# Patient Record
Sex: Male | Born: 2005 | Race: Black or African American | Hispanic: No | Marital: Single | Smoking: Never smoker
Health system: Southern US, Community
[De-identification: ages and names within clinical notes are randomized; demographics above are authoritative.]

## PROBLEM LIST (undated history)

## (undated) DIAGNOSIS — F909 Attention-deficit hyperactivity disorder, unspecified type: Secondary | ICD-10-CM

## (undated) DIAGNOSIS — A4902 Methicillin resistant Staphylococcus aureus infection, unspecified site: Secondary | ICD-10-CM

## (undated) DIAGNOSIS — T148XXA Other injury of unspecified body region, initial encounter: Secondary | ICD-10-CM

## (undated) DIAGNOSIS — J45909 Unspecified asthma, uncomplicated: Secondary | ICD-10-CM

## (undated) HISTORY — PX: CLOSED REDUCTION METACARPAL FRACTURE: SUR226

---
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
## 2024-06-08 ENCOUNTER — Ambulatory Visit
Admission: EM | Admit: 2024-06-08 | Discharge: 2024-06-08 | Disposition: A | Discharge status: Home / Self Care | Source: Home / Self Care

## 2024-06-08 ENCOUNTER — Encounter: Payer: Self-pay | Admitting: Emergency Medicine

## 2024-06-08 DIAGNOSIS — R300 Dysuria: Secondary | ICD-10-CM

## 2024-06-08 LAB — POCT URINE DIPSTICK
Bilirubin, UA: NEGATIVE
Glucose, UA: NEGATIVE mg/dL
Ketones, POC UA: NEGATIVE mg/dL
Nitrite, UA: NEGATIVE
POC PROTEIN,UA: NEGATIVE
Spec Grav, UA: 1.025
Urobilinogen, UA: 0.2 U/dL
pH, UA: 6.5

## 2024-06-08 MED ORDER — DOXYCYCLINE HYCLATE 100 MG PO CAPS: 100.0000 mg | ORAL_CAPSULE | Freq: Two times a day (BID) | ORAL | 0 refills | Status: AC

## 2024-06-08 MED ORDER — CEFTRIAXONE SODIUM 500 MG IJ SOLR
500.0000 mg | Freq: Once | INTRAMUSCULAR | Status: AC
  Administered 2024-06-08: 500 mg via INTRAMUSCULAR

## 2024-06-08 NOTE — Discharge Instructions (Addendum)
 Return if any problems.

## 2024-06-08 NOTE — ED Provider Notes (Signed)
 " [facility]    CSN: [id number] Arrival date & time: 06/08/24  1101      History   Chief Complaint Chief Complaint  Patient presents with   Headache   Dizziness   Dysuria    HPI Jeremy Acevedo is a 19 y.o. male.   Patient complains of concerns of STD risk.  Patient reports he has had burning with urination patient has also had a headache.  Patient states his partner may have exposed him to something.  Patient denies any fever or chills no nausea or vomiting.  Patient would like treatment for STDs as well as testing  The history is provided by the patient. No language interpreter was used.  Headache Associated symptoms: dizziness   Dizziness Associated symptoms: headaches   Dysuria Presenting symptoms: dysuria     Past Medical History:  Diagnosis Date   ADHD    Asthma    Fracture    right hand   MRSA (methicillin resistant Staphylococcus aureus)     There are no active problems to display for this patient.   Past Surgical History:  Procedure Laterality Date   CLOSED REDUCTION METACARPAL FRACTURE     5 th right hand       Home Medications    Prior to Admission medications  Medication Sig Start Date End Date Taking? Authorizing Provider  doxycycline  (VIBRAMYCIN ) 100 MG capsule Take 1 capsule (100 mg total) by mouth 2 (two) times daily. 06/08/24  Yes Flint Sonny POUR, PA-C    Family History Family History  Problem Relation Age of Onset   Diabetes Other    Healthy Mother    Healthy Father     Social History Social History[1]   Allergies   Patient has no known allergies.   Review of Systems Review of Systems  Genitourinary:  Positive for dysuria.  Neurological:  Positive for dizziness and headaches.  All other systems reviewed and are negative.    Physical Exam Triage Vital Signs ED Triage Vitals  Encounter Vitals Group     BP 06/08/24 1121 134/89     Girls Systolic BP Percentile --      Girls Diastolic BP Percentile --       Boys Systolic BP Percentile --      Boys Diastolic BP Percentile --      Pulse Rate 06/08/24 1121 82     Resp 06/08/24 1121 17     Temp 06/08/24 1132 98.3 F (36.8 C)     Temp Source 06/08/24 1121 Tympanic     SpO2 06/08/24 1121 99 %     Weight --      Height --      Head Circumference --      Peak Flow --      Pain Score 06/08/24 1131 6     Pain Loc --      Pain Education --      Exclude from Growth Chart --    No data found.  Updated Vital Signs BP 134/89 (BP Location: Right Arm)   Pulse 82   Temp 98.3 F (36.8 C) (Oral)   Resp 17   SpO2 99%   Visual Acuity Right Eye Distance:   Left Eye Distance:   Bilateral Distance:    Right Eye Near:   Left Eye Near:    Bilateral Near:     Physical Exam Vitals and nursing note reviewed.  Constitutional:      Appearance: He is well-developed.  HENT:  Head: Normocephalic.  Cardiovascular:     Rate and Rhythm: Normal rate.  Pulmonary:     Effort: Pulmonary effort is normal.  Abdominal:     General: There is no distension.  Musculoskeletal:        General: Normal range of motion.     Cervical back: Normal range of motion.  Skin:    General: Skin is warm.  Neurological:     General: No focal deficit present.     Mental Status: He is alert and oriented to person, place, and time.      UC Treatments / Results  Labs (all labs ordered are listed, but only abnormal results are displayed) Labs Reviewed  POCT URINE DIPSTICK - Abnormal; Notable for the following components:      Result Value   Clarity, UA cloudy (*)    Blood, UA trace-intact (*)    Leukocytes, UA Small (1+) (*)    All other components within normal limits  CYTOLOGY, (ORAL, ANAL, URETHRAL) ANCILLARY ONLY    EKG   Radiology No results found.  Procedures Procedures (including critical care time)  Medications Ordered in UC Medications  cefTRIAXone  (ROCEPHIN ) injection 500 mg (has no administration in time range)    Initial Impression /  Assessment and Plan / UC Course  I have reviewed the triage vital signs and the nursing notes.  Pertinent labs & imaging results that were available during my care of the patient were reviewed by me and considered in my medical decision making (see chart for details).     UA does show some leukocytosis.  Patient is given an injection of Rocephin  and a prescription for doxycycline  GC chlamydia and trichomonas are pending.  Patient is discharged in stable condition Final Clinical Impressions(s) / UC Diagnoses   Final diagnoses:  Dysuria     Discharge Instructions      Return if any problems.    ED Prescriptions     Medication Sig Dispense Auth. Provider   doxycycline  (VIBRAMYCIN ) 100 MG capsule Take 1 capsule (100 mg total) by mouth 2 (two) times daily. 20 capsule Dima Mini K, PA-C      PDMP not reviewed this encounter. An After Visit Summary was printed and given to the patient.        [1]  Social History Tobacco Use   Smoking status: Never   Smokeless tobacco: Never  Vaping Use   Vaping status: Never Used  Substance Use Topics   Alcohol use: Never   Drug use: Never     Flint Sonny POUR, PA-C 06/08/24 1158  "

## 2024-06-08 NOTE — ED Triage Notes (Signed)
 Patient presents for headache and dizziness x 2 weeks.  Patient has pain with urination x 2 days.  Patient has penile discharge.  Patient has taken AZO medication.

## 2024-06-09 LAB — CYTOLOGY, (ORAL, ANAL, URETHRAL) ANCILLARY ONLY
Chlamydia: NEGATIVE
Comment: NEGATIVE
Comment: NEGATIVE
Comment: NORMAL
Neisseria Gonorrhea: POSITIVE — AB
Trichomonas: POSITIVE — AB
